# Patient Record
Sex: Male | Born: 2000 | Hispanic: Yes | Marital: Single | Smoking: Never smoker
Health system: Southern US, Community
[De-identification: ages and names within clinical notes are randomized; demographics above are authoritative.]

## PROBLEM LIST (undated history)

## (undated) DIAGNOSIS — F419 Anxiety disorder, unspecified: Secondary | ICD-10-CM

## (undated) DIAGNOSIS — R002 Palpitations: Secondary | ICD-10-CM

## (undated) HISTORY — DX: Anxiety disorder, unspecified: F41.9

## (undated) HISTORY — DX: Palpitations: R00.2

---
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
## 2023-04-04 IMAGING — DX DG CHEST 2V
2 series · 2 of 2 positions shown · non-contrast
Comparison: None.

CLINICAL DATA: Chest pain and tachycardia.

EXAM:
CHEST - 2 VIEW

[chest pa]
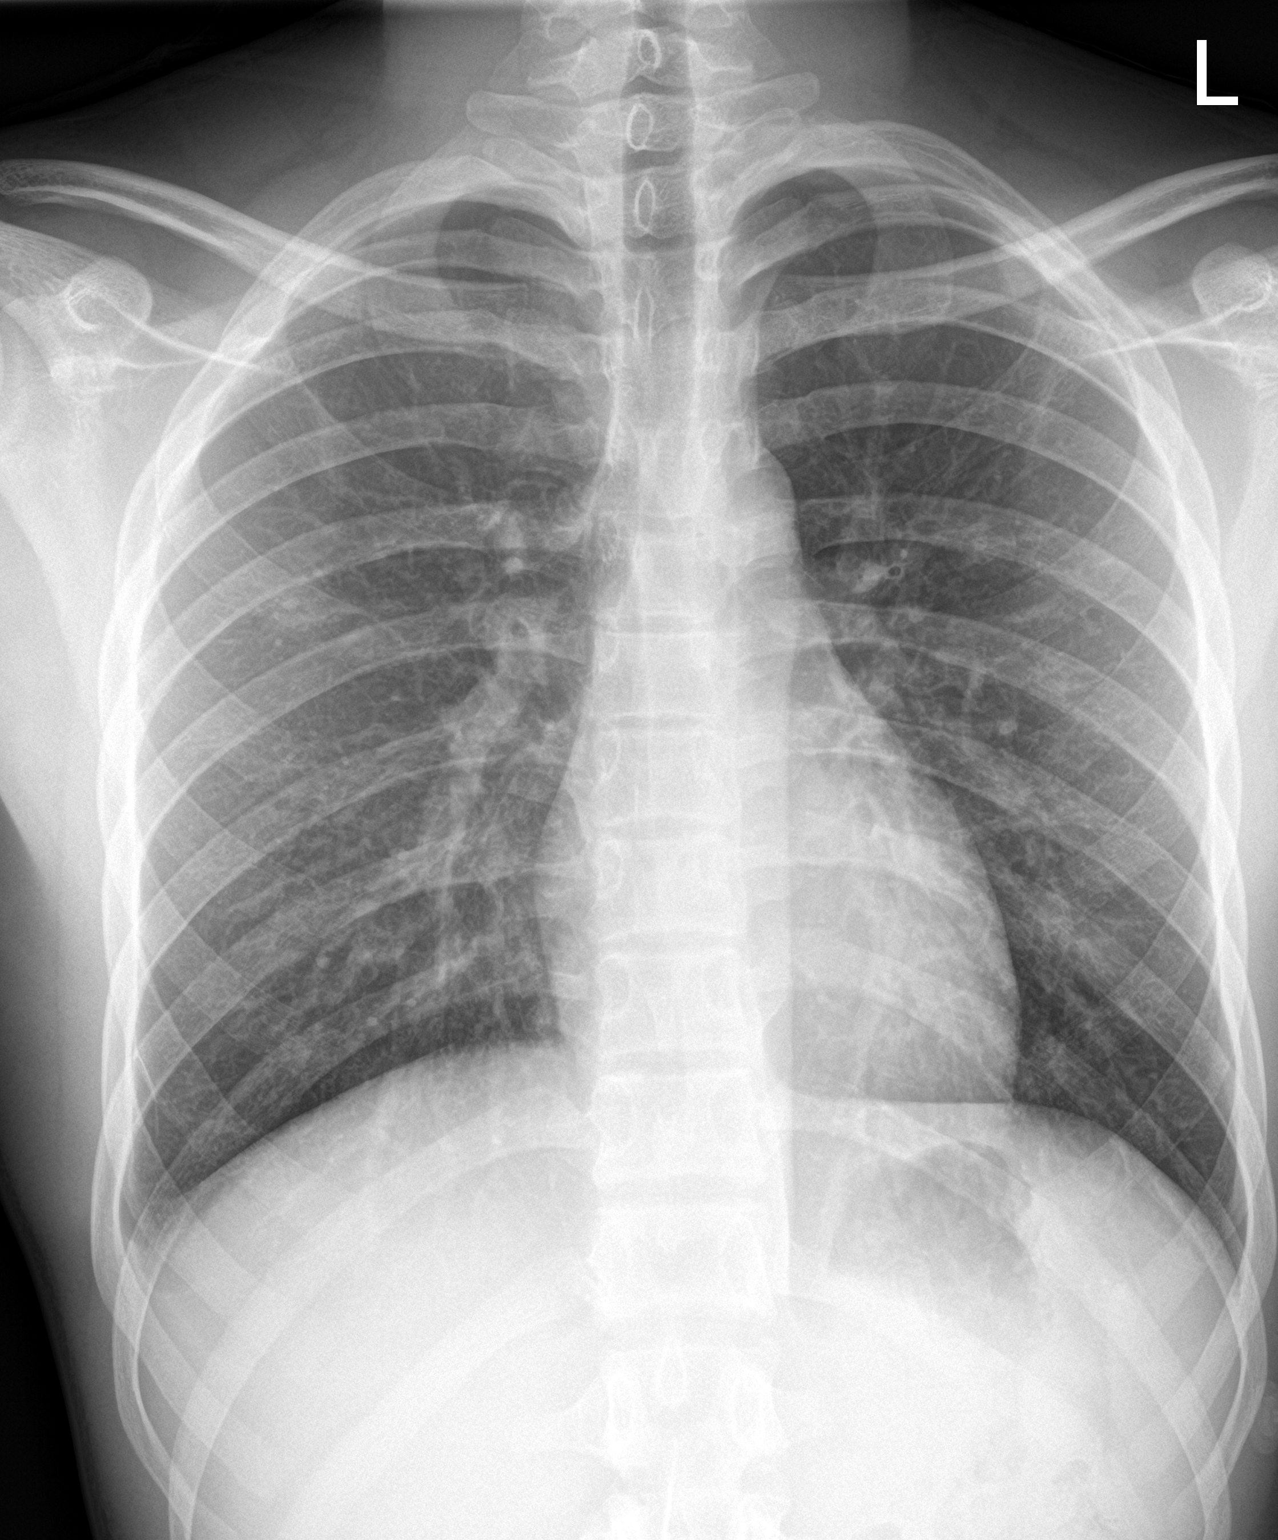

[chest lat]
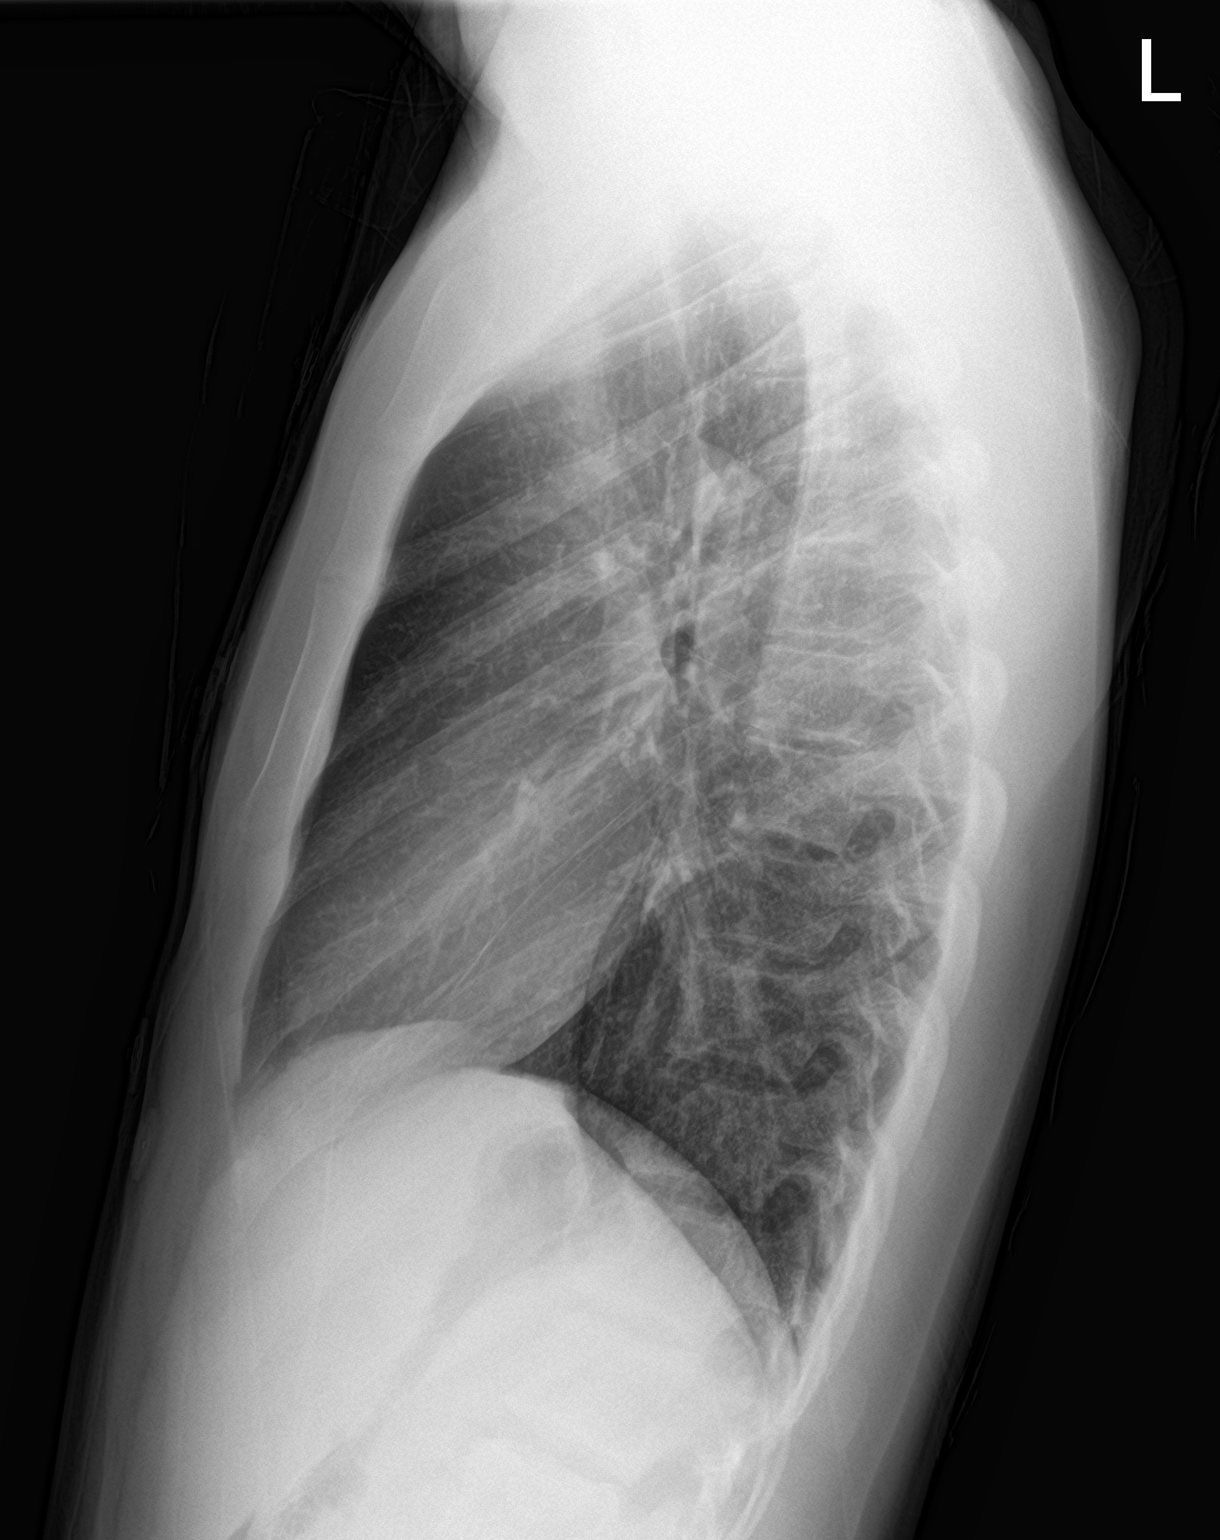

[2 of 2 positions shown; findings below may reference images not displayed]

FINDINGS: The heart size and mediastinal contours are within normal limits.
Both lungs are clear. The visualized skeletal structures are
unremarkable.
IMPRESSION: No active cardiopulmonary disease.
